# Patient Record
Sex: Female | Born: 1955 | Hispanic: No | Marital: Married | Smoking: Never smoker
Health system: Southern US, Community
[De-identification: ages and names within clinical notes are randomized; demographics above are authoritative.]

## PROBLEM LIST (undated history)

## (undated) DIAGNOSIS — I10 Essential (primary) hypertension: Secondary | ICD-10-CM

## (undated) DIAGNOSIS — E119 Type 2 diabetes mellitus without complications: Secondary | ICD-10-CM

## (undated) DIAGNOSIS — R7303 Prediabetes: Secondary | ICD-10-CM

## (undated) DIAGNOSIS — I639 Cerebral infarction, unspecified: Secondary | ICD-10-CM

## (undated) DIAGNOSIS — I8393 Asymptomatic varicose veins of bilateral lower extremities: Secondary | ICD-10-CM

## (undated) DIAGNOSIS — E785 Hyperlipidemia, unspecified: Secondary | ICD-10-CM

## (undated) HISTORY — DX: Prediabetes: R73.03

## (undated) HISTORY — DX: Asymptomatic varicose veins of bilateral lower extremities: I83.93

## (undated) HISTORY — DX: Hyperlipidemia, unspecified: E78.5

## (undated) HISTORY — DX: Essential (primary) hypertension: I10

## (undated) HISTORY — DX: Type 2 diabetes mellitus without complications: E11.9

---

## 2013-10-11 ENCOUNTER — Encounter (HOSPITAL_COMMUNITY): Payer: Self-pay | Admitting: Emergency Medicine

## 2013-10-11 ENCOUNTER — Emergency Department (HOSPITAL_COMMUNITY)
Admission: EM | Admit: 2013-10-11 | Discharge: 2013-10-12 | Disposition: A | Discharge status: Home / Self Care | Payer: Self-pay | Attending: Emergency Medicine | Admitting: Emergency Medicine

## 2013-10-11 DIAGNOSIS — G8929 Other chronic pain: Secondary | ICD-10-CM | POA: Insufficient documentation

## 2013-10-11 DIAGNOSIS — Z7901 Long term (current) use of anticoagulants: Secondary | ICD-10-CM | POA: Insufficient documentation

## 2013-10-11 DIAGNOSIS — L03019 Cellulitis of unspecified finger: Secondary | ICD-10-CM | POA: Insufficient documentation

## 2013-10-11 DIAGNOSIS — R519 Headache, unspecified: Secondary | ICD-10-CM

## 2013-10-11 DIAGNOSIS — R51 Headache: Secondary | ICD-10-CM | POA: Insufficient documentation

## 2013-10-11 DIAGNOSIS — Z8781 Personal history of (healed) traumatic fracture: Secondary | ICD-10-CM | POA: Insufficient documentation

## 2013-10-11 DIAGNOSIS — IMO0002 Reserved for concepts with insufficient information to code with codable children: Secondary | ICD-10-CM

## 2013-10-11 DIAGNOSIS — Z23 Encounter for immunization: Secondary | ICD-10-CM | POA: Insufficient documentation

## 2013-10-11 NOTE — ED Notes (Signed)
New EKG given to Dr Lavella LemonsManly

## 2013-10-11 NOTE — ED Notes (Signed)
Per pt's family: pt is having generalized body ache, shortness of breath and weakness for two weeks. Pt reports more pain on the right side of the body as well as chest and upper back. Pt states she has taken tylenol without relief. Pt is A&Ox4, respirations equal and unlabored, skin warm and dry

## 2013-10-12 ENCOUNTER — Emergency Department (HOSPITAL_COMMUNITY): Payer: Self-pay

## 2013-10-12 LAB — COMPREHENSIVE METABOLIC PANEL
ALK PHOS: 83 U/L (ref 39–117)
ALT: 10 U/L (ref 0–35)
AST: 17 U/L (ref 0–37)
Albumin: 3.2 g/dL — ABNORMAL LOW (ref 3.5–5.2)
BUN: 10 mg/dL (ref 6–23)
CHLORIDE: 105 meq/L (ref 96–112)
CO2: 22 meq/L (ref 19–32)
CREATININE: 0.48 mg/dL — AB (ref 0.50–1.10)
Calcium: 9.1 mg/dL (ref 8.4–10.5)
GFR calc Af Amer: >90 mL/min (ref >90)
GLUCOSE: 100 mg/dL — AB (ref 70–99)
POTASSIUM: 4.1 meq/L (ref 3.7–5.3)
Sodium: 140 mEq/L (ref 137–147)
Total Protein: 8.5 g/dL — ABNORMAL HIGH (ref 6.0–8.3)

## 2013-10-12 LAB — I-STAT TROPONIN, ED: TROPONIN I, POC: 0 ng/mL (ref 0.00–0.08)

## 2013-10-12 LAB — CBC WITH DIFFERENTIAL/PLATELET
Basophils Absolute: 0 10*3/uL (ref 0.0–0.1)
Basophils Relative: 0 % (ref 0–1)
Eosinophils Absolute: 0.1 10*3/uL (ref 0.0–0.7)
Eosinophils Relative: 1 % (ref 0–5)
HCT: 40.8 % (ref 36.0–46.0)
HEMOGLOBIN: 13.3 g/dL (ref 12.0–15.0)
LYMPHS ABS: 3.5 10*3/uL (ref 0.7–4.0)
LYMPHS PCT: 36 % (ref 12–46)
MCH: 27.3 pg (ref 26.0–34.0)
MCHC: 32.6 g/dL (ref 30.0–36.0)
MCV: 83.8 fL (ref 78.0–100.0)
MONO ABS: 0.9 10*3/uL (ref 0.1–1.0)
MONOS PCT: 9 % (ref 3–12)
NEUTROS ABS: 5.2 10*3/uL (ref 1.7–7.7)
NEUTROS PCT: 54 % (ref 43–77)
Platelets: 243 10*3/uL (ref 150–400)
RBC: 4.87 MIL/uL (ref 3.87–5.11)
RDW: 14.7 % (ref 11.5–15.5)
WBC: 9.7 10*3/uL (ref 4.0–10.5)

## 2013-10-12 LAB — PROTIME-INR
INR: 0.96 (ref 0.00–1.49)
Prothrombin Time: 12.6 seconds (ref 11.6–15.2)

## 2013-10-12 MED ORDER — TETANUS-DIPHTHERIA TOXOIDS TD 5-2 LFU IM INJ
0.5000 mL | INJECTION | Freq: Once | INTRAMUSCULAR | Status: AC
  Administered 2013-10-12: 0.5 mL via INTRAMUSCULAR
  Filled 2013-10-12: qty 0.5

## 2013-10-12 MED ORDER — SULFAMETHOXAZOLE-TRIMETHOPRIM 800-160 MG PO TABS: 1.0000 | ORAL_TABLET | Freq: Two times a day (BID) | ORAL | Status: AC

## 2013-10-12 NOTE — ED Notes (Signed)
Patient transported to CT 

## 2013-10-12 NOTE — ED Notes (Signed)
Patient transported to X-ray 

## 2013-10-12 NOTE — ED Notes (Signed)
MD at bedside. 

## 2013-10-12 NOTE — ED Provider Notes (Signed)
CSN: [id number]     Arrival date & time 10/11/13  2250 History   First MD Initiated Contact with Patient 10/11/13 2358     Chief Complaint  Patient presents with  . Chest Pain  . Generalized Body Aches  . Headache  . Weakness     (Consider location/radiation/quality/duration/timing/severity/associated sxs/prior Treatment) HPI  Patient is a 58 year old Arabic speaking woman who was recently immigrated from [place]. She is here with her daughter and son-in-law who translates.  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 She is taking Coumadin which has been prescribed to her from Dr. in [place]. He is not have a physician in this area yet. She has not had her INR checked.  Patient presents with right-sided headache. She has chronic right-sided headache. She has headaches all the time, every day. Always on the right side. She says her headache today is worse than her typical headache but at the same quality and nature. Nothing makes it worse or better. No fever. No neurologic deficits. No nausea, vomiting or photophobia.  The patient has an aching pain in her right neck. No paresthesias or motor weakness.  She also complains of pain in the distal aspect of her right middle finger. The pain is aching and throbbing.  History reviewed. No pertinent past medical history. History reviewed. No pertinent past surgical history. History reviewed. No pertinent family history. History  Substance Use Topics  . Smoking status: Never Smoker   . Smokeless tobacco: Never Used  . Alcohol Use: No   OB History   Grav Para Term Preterm Abortions TAB SAB Ect Mult Living                 Review of Systems Ten point review of symptoms performed and is negative with the exception of symptoms noted above.     Allergies  Review of patient's allergies indicates no known allergies.  Home Medications   Prior to Admission medications   Not on File   BP 138/85  Pulse 89  Temp(Src) 99.1 F  (37.3 C) (Oral)  Resp 20  SpO2 97% Physical Exam Gen: well developed and well nourished appearing Head: NCAT Eyes: PERL, EOMI Nose: no epistaixis or rhinorrhea Mouth/throat: mucosa is moist and pink Neck: supple, no stridor, no midline ttp. ttp over the right trapezius m.  Lungs: CTA B, no wheezing, rhonchi or rales CV: regular rate and rythm, good distal pulses.  Abd: soft, notender, nondistended Back: no ttp, no cva ttp Skin: warm and dry Ext: no edema, normal to inspection x for paronychia of the right middle finger Neuro: CN ii-xii grossly intact, no focal deficits, 5 over 5 motor strength all 4 extremities. Psyche; normal affect,  calm and cooperative.  ED Course  Procedures (including critical care time) Labs Review  Results for orders placed during the hospital encounter of 10/11/13 (from the past 24 hour(s))  PROTIME-INR     Status: None   Collection Time    10/12/13 12:17 AM      Result Value Ref Range   Prothrombin Time 12.6  11.6 - 15.2 seconds   INR 0.96  0.00 - 1.49  CBC WITH DIFFERENTIAL     Status: None   Collection Time    10/12/13 12:25 AM      Result Value Ref Range   WBC 9.7  4.0 - 10.5 K/uL   RBC 4.87  3.87 - 5.11 MIL/uL   Hemoglobin 13.3  12.0 - 15.0 g/dL  HCT 40.8  36.0 - 46.0 %   MCV 83.8  78.0 - 100.0 fL   MCH 27.3  26.0 - 34.0 pg   MCHC 32.6  30.0 - 36.0 g/dL   RDW 19.114.7  47.811.5 - 29.515.5 %   Platelets 243  150 - 400 K/uL   Neutrophils Relative % 54  43 - 77 %   Neutro Abs 5.2  1.7 - 7.7 K/uL   Lymphocytes Relative 36  12 - 46 %   Lymphs Abs 3.5  0.7 - 4.0 K/uL   Monocytes Relative 9  3 - 12 %   Monocytes Absolute 0.9  0.1 - 1.0 K/uL   Eosinophils Relative 1  0 - 5 %   Eosinophils Absolute 0.1  0.0 - 0.7 K/uL   Basophils Relative 0  0 - 1 %   Basophils Absolute 0.0  0.0 - 0.1 K/uL  COMPREHENSIVE METABOLIC PANEL     Status: Abnormal   Collection Time    10/12/13 12:25 AM      Result Value Ref Range   Sodium 140  137 - 147 mEq/L   Potassium  4.1  3.7 - 5.3 mEq/L   Chloride 105  96 - 112 mEq/L   CO2 22  19 - 32 mEq/L   Glucose, Bld 100 (*) 70 - 99 mg/dL   BUN 10  6 - 23 mg/dL   Creatinine, Ser 6.210.48 (*) 0.50 - 1.10 mg/dL   Calcium 9.1  8.4 - 30.810.5 mg/dL   Total Protein 8.5 (*) 6.0 - 8.3 g/dL   Albumin 3.2 (*) 3.5 - 5.2 g/dL   AST 17  0 - 37 U/L   ALT 10  0 - 35 U/L   Alkaline Phosphatase 83  39 - 117 U/L   Total Bilirubin <0.2 (*) 0.3 - 1.2 mg/dL   GFR calc non Af Amer >90  >90 mL/min   GFR calc Af Amer >90  >90 mL/min  I-STAT TROPOININ, ED     Status: None   Collection Time    10/12/13 12:42 AM      Result Value Ref Range   Troponin i, poc 0.00  0.00 - 0.08 ng/mL   Comment 3            CT head: no ICH  EKG: nsr, no acute ischemic changes, normal intervals, normal axis, normal qrs complex  PROCEDURE NOTE:  1% LIDOCAINE WITHOUT EPINEPHRINE USED TO PERFORM DIGITAL BLOCK TO THE RIGHT MIDDLE FINGER. ADEQUATE ANESTHESIA OBTAINED. 11 BLADE USED TO UNROOF SUSPECTED PARONYCHIA. NO PUS DRAINED. NO COMPLICATIONS. PATIENT TOLERATED THE PROCEDURE WELL.   MDM   Patient with paronychia - unable to drain any pus. Will tx with Bactrim. F[facility] at [facility]  Patient reports history of blood clot to brain and is on Warfarin for same. No old records. All of her tx and evaluation took place in [place]. Subtherapeutic on Warfarin. But, I she does not know her dose and I don't understand just what the indication is or was for warfarin. So, no adjustments will be made to medication. Patient referred to [facility] for further evaluation.     Brandt LoosenJulie Manly, MD 10/12/13 [phone]

## 2013-10-16 ENCOUNTER — Encounter (HOSPITAL_COMMUNITY): Payer: Self-pay | Admitting: Emergency Medicine

## 2013-10-16 DIAGNOSIS — Z7901 Long term (current) use of anticoagulants: Secondary | ICD-10-CM | POA: Insufficient documentation

## 2013-10-16 DIAGNOSIS — R234 Changes in skin texture: Secondary | ICD-10-CM | POA: Insufficient documentation

## 2013-10-16 DIAGNOSIS — Z792 Long term (current) use of antibiotics: Secondary | ICD-10-CM | POA: Insufficient documentation

## 2013-10-16 DIAGNOSIS — R21 Rash and other nonspecific skin eruption: Secondary | ICD-10-CM | POA: Insufficient documentation

## 2013-10-16 DIAGNOSIS — Z8673 Personal history of transient ischemic attack (TIA), and cerebral infarction without residual deficits: Secondary | ICD-10-CM | POA: Insufficient documentation

## 2013-10-16 DIAGNOSIS — R51 Headache: Secondary | ICD-10-CM | POA: Insufficient documentation

## 2013-10-16 LAB — URINALYSIS, ROUTINE W REFLEX MICROSCOPIC
Bilirubin Urine: NEGATIVE
Glucose, UA: NEGATIVE mg/dL
Hgb urine dipstick: NEGATIVE
Ketones, ur: NEGATIVE mg/dL
LEUKOCYTES UA: NEGATIVE
Nitrite: NEGATIVE
PROTEIN: NEGATIVE mg/dL
SPECIFIC GRAVITY, URINE: 1.007 (ref 1.005–1.030)
Urobilinogen, UA: 0.2 mg/dL (ref 0.0–1.0)
pH: 7 (ref 5.0–8.0)

## 2013-10-16 LAB — CBC
HCT: 40.2 % (ref 36.0–46.0)
Hemoglobin: 12.9 g/dL (ref 12.0–15.0)
MCH: 27.4 pg (ref 26.0–34.0)
MCHC: 32.1 g/dL (ref 30.0–36.0)
MCV: 85.4 fL (ref 78.0–100.0)
PLATELETS: 261 10*3/uL (ref 150–400)
RBC: 4.71 MIL/uL (ref 3.87–5.11)
RDW: 14.6 % (ref 11.5–15.5)
WBC: 7.6 10*3/uL (ref 4.0–10.5)

## 2013-10-16 NOTE — ED Notes (Addendum)
Pt reports she was seen here last Friday for migraine and was given a shot. Today she still has migraine and on her R arm where the injection was administered it appears red and indurated and tender to touch. Pt speaks arabic- family at bedside translating. Pt ambulatory, no distress noted at this time. Pt also reports some abdominal discomfort and pain when she urinates. Last BM today. Denies nv.

## 2013-10-17 ENCOUNTER — Emergency Department (HOSPITAL_COMMUNITY)
Admission: EM | Admit: 2013-10-17 | Discharge: 2013-10-17 | Disposition: A | Discharge status: Home / Self Care | Payer: Self-pay | Attending: Emergency Medicine | Admitting: Emergency Medicine

## 2013-10-17 ENCOUNTER — Emergency Department (HOSPITAL_COMMUNITY): Payer: Self-pay

## 2013-10-17 DIAGNOSIS — R51 Headache: Secondary | ICD-10-CM

## 2013-10-17 DIAGNOSIS — R519 Headache, unspecified: Secondary | ICD-10-CM

## 2013-10-17 DIAGNOSIS — T8090XA Unspecified complication following infusion and therapeutic injection, initial encounter: Secondary | ICD-10-CM

## 2013-10-17 HISTORY — DX: Cerebral infarction, unspecified: I63.9

## 2013-10-17 LAB — COMPREHENSIVE METABOLIC PANEL
ALBUMIN: 3.2 g/dL — AB (ref 3.5–5.2)
ALT: 12 U/L (ref 0–35)
AST: 19 U/L (ref 0–37)
Alkaline Phosphatase: 78 U/L (ref 39–117)
BUN: 9 mg/dL (ref 6–23)
CHLORIDE: 99 meq/L (ref 96–112)
CO2: 26 meq/L (ref 19–32)
Calcium: 9.2 mg/dL (ref 8.4–10.5)
Creatinine, Ser: 0.59 mg/dL (ref 0.50–1.10)
GFR calc Af Amer: >90 mL/min (ref >90)
Glucose, Bld: 130 mg/dL — ABNORMAL HIGH (ref 70–99)
Potassium: 4.4 mEq/L (ref 3.7–5.3)
Sodium: 140 mEq/L (ref 137–147)
Total Protein: 8.3 g/dL (ref 6.0–8.3)

## 2013-10-17 LAB — PROTIME-INR
INR: 0.99 (ref 0.00–1.49)
PROTHROMBIN TIME: 12.9 s (ref 11.6–15.2)

## 2013-10-17 MED ORDER — DEXAMETHASONE SODIUM PHOSPHATE 4 MG/ML IJ SOLN
10.0000 mg | Freq: Once | INTRAMUSCULAR | Status: AC
  Administered 2013-10-17: 10 mg via INTRAVENOUS
  Filled 2013-10-17: qty 3

## 2013-10-17 MED ORDER — METOCLOPRAMIDE HCL 5 MG/ML IJ SOLN
10.0000 mg | Freq: Once | INTRAMUSCULAR | Status: AC
  Administered 2013-10-17: 10 mg via INTRAVENOUS
  Filled 2013-10-17: qty 2

## 2013-10-17 MED ORDER — DIPHENHYDRAMINE HCL 50 MG/ML IJ SOLN
25.0000 mg | Freq: Once | INTRAMUSCULAR | Status: AC
  Administered 2013-10-17: 25 mg via INTRAVENOUS
  Filled 2013-10-17: qty 1

## 2013-10-17 MED ORDER — SODIUM CHLORIDE 0.9 % IV SOLN
INTRAVENOUS | Status: DC
Start: 2013-10-17 — End: 2013-10-17
  Administered 2013-10-17: 03:00:00 via INTRAVENOUS

## 2013-10-17 NOTE — ED Notes (Signed)
Pt states pain in upper right arm. NT Dan touched upper right arm and felt hardness of skin.

## 2013-10-17 NOTE — ED Provider Notes (Signed)
CSN: [id number]     Arrival date & time 10/16/13  2145 History   First MD Initiated Contact with Patient 10/17/13 0146     Chief Complaint  Patient presents with  . Migraine     (Consider location/radiation/quality/duration/timing/severity/associated sxs/prior Treatment) Patient is a 58 y.o. female presenting with migraines.  Migraine Associated symptoms include headaches. Pertinent negatives include no chest pain, no abdominal pain and no shortness of breath.   History provided by patient's family. Is from [place]. Patient does not speak English and her family translates. She is prescribed warfarin 3 mg which she takes daily for history of a stroke.  She did not bring medication with her but insists that she takes it as directed.  She was evaluated here a few days ago for right-sided headache. She received an injection in her right shoulder. Now she has redness and swelling at that injection site and persistent right-sided headache. She denies any weakness or numbness. No difficulty with speech, gait or vision. She states that the arm pain and headache are both severe. She was also evaluated for paronychia on previous visit and that has improved. She was given an outpatient referral but has not called for an appointment.  Past Medical History  Diagnosis Date  . Stroke     family reports stroke 2 years ago   History reviewed. No pertinent past surgical history. No family history on file. History  Substance Use Topics  . Smoking status: Never Smoker   . Smokeless tobacco: Never Used  . Alcohol Use: No   OB History   Grav Para Term Preterm Abortions TAB SAB Ect Mult Living                 Review of Systems  Constitutional: Negative for fever and chills.  Eyes: Negative for pain.  Respiratory: Negative for shortness of breath.   Cardiovascular: Negative for chest pain.  Gastrointestinal: Negative for abdominal pain.  Genitourinary: Negative for dysuria.  Musculoskeletal: Negative  for back pain, neck pain and neck stiffness.  Skin: Positive for rash.  Neurological: Positive for headaches.  All other systems reviewed and are negative.     Allergies  Review of patient's allergies indicates no known allergies.  Home Medications   Prior to Admission medications   Medication Sig Start Date End Date Taking? Authorizing Provider  acetaminophen (TYLENOL) 500 MG tablet Take 500 mg by mouth every 6 (six) hours as needed for moderate pain.   Yes Historical Provider, MD  Multiple Vitamin (MULTIVITAMIN WITH MINERALS) TABS tablet Take 1 tablet by mouth daily.   Yes Historical Provider, MD  sulfamethoxazole-trimethoprim (BACTRIM DS,SEPTRA DS) 800-160 MG per tablet Take 1 tablet by mouth 2 (two) times daily. 10/12/13 10/19/13 Yes Brandt LoosenJulie Manly, MD  warfarin (COUMADIN) 3 MG tablet Take 3 mg by mouth daily.   Yes Historical Provider, MD   BP 124/85  Pulse 98  Temp(Src) 98.9 F (37.2 C) (Oral)  Resp 24  SpO2 97% Physical Exam  Constitutional: She appears well-developed and well-nourished.  HENT:  Head: Normocephalic and atraumatic.  Eyes: EOM are normal. Pupils are equal, round, and reactive to light.  Neck: Neck supple.  Cardiovascular: Normal rate, regular rhythm and intact distal pulses.   Pulmonary/Chest: Effort normal and breath sounds normal. No respiratory distress.  Abdominal: Soft. Bowel sounds are normal. She exhibits no distension. There is no tenderness.  Musculoskeletal: Normal range of motion.  Small area of induration over right deltoid. There is no erythema or increased warmth  to touch. There is no underlying fluctuance. No drainage. No pointing. Good range of motion throughout all major joints of the right upper extremity with distal neurovascular intact.  Neurological: She is alert.  Awake and alert. Speech clear. Equal grips, triceps biceps strength. Equal dorsi plantar flexion. Moving all extremities x4. No pronator drift. Fine motor movements intact upper  extremities.  Skin: Skin is warm and dry.    ED Course  Procedures (including critical care time) Labs Review Labs Reviewed  COMPREHENSIVE METABOLIC PANEL - Abnormal; Notable for the following:    Glucose, Bld 130 (*)    Albumin 3.2 (*)    Total Bilirubin <0.2 (*)    All other components within normal limits  CBC  URINALYSIS, ROUTINE W REFLEX MICROSCOPIC  PROTIME-INR    Imaging Review Ct Head Wo Contrast  10/17/2013   CLINICAL DATA:  Persistent migraine headaches.  EXAM: CT HEAD WITHOUT CONTRAST  TECHNIQUE: Contiguous axial images were obtained from the base of the skull through the vertex without intravenous contrast.  COMPARISON:  10/12/2013  FINDINGS: Ventricles and sulci appear symmetrical. No ventricular dilatation. Basal ganglia calcifications. No mass effect or midline shift. No abnormal extra-axial fluid collections. Gray-white matter junctions are distinct. Basal cisterns are not effaced. No evidence of acute intracranial hemorrhage. No depressed skull fractures. Visualized paranasal sinuses and mastoid air cells are not opacified. No change since previous study.  IMPRESSION: No acute intracranial abnormalities.   Electronically Signed   By: Burman NievesWilliam  Stevens M.D.   On: 10/17/2013 03:15     EKG Interpretation None     IV fluids and headache cocktail provided. Ice placed to right deltoid injection site.  4:19 AM on recheck headache has resolved. Arm pain feels better.  Plan discharge home with followup at the [facility]. Patient and family agree to call the morning to schedule an appointment. Stable appropriate for discharge at this time. Now has 2 visits with INR in normal range, and patient insisting that she is taking warfarin. I asked family to bring this medication to her appointment and they agree. Strict return precautions provided verbalized as understood. MDM   Diagnosis: Headache, induration recent injection site right deltoid  Non-English-speaking patient  comes in with family, states she is on Coumadin with severe headache. She was evaluated CT scan and treated with IV fluids. Her headache resolved. No intracranial bleed. No neuro deficits. Patient also complaining of right deltoid pain with induration at recent injection site. No evidence of cellulitis. There is no underlying abscess clinically. Symptomatically improved with medications and ice. I do not feel further treatment is indicated at this point. Vital signs and nursing notes reviewed and considered. Previous EMR records reviewed - patient also had normal INR with previous visit, states she has not missed any doses of warfarin.     Sunnie NielsenBrian Savhanna Sliva, MD 10/17/13 [phone]

## 2013-10-17 NOTE — Discharge Instructions (Signed)

## 2014-08-03 IMAGING — CT CT HEAD W/O CM
1 series · 16 of 27 positions shown, 20 images · non-contrast
Comparison: 10/12/2013

CLINICAL DATA: Persistent migraine headaches.

EXAM:
CT HEAD WITHOUT CONTRAST
TECHNIQUE: Contiguous axial images were obtained from the base of the skull
through the vertex without intravenous contrast.

[Series 2: head 5.0 h30s · axial · 0.41mm/px · z∈[+1329,+1449]mm · 16 of 27 slices shown, 20 images]
[im 2/27  brain]
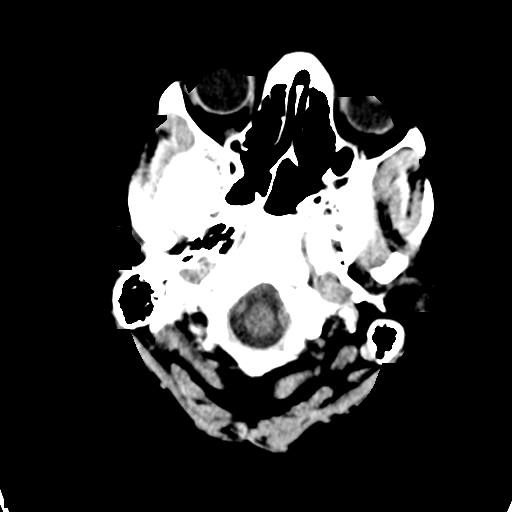
[im 2/27  bone]
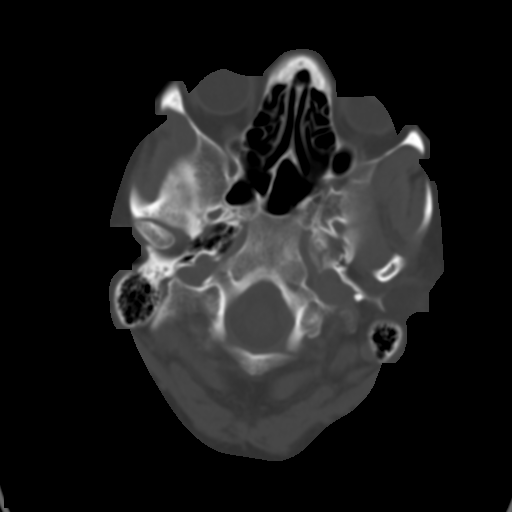
[im 4/27  brain]
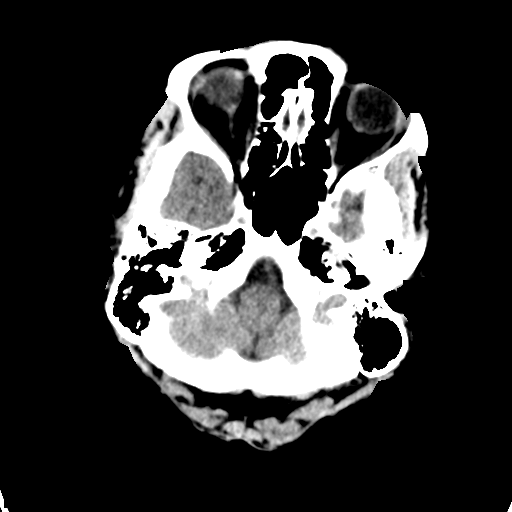
[im 5/27  brain]
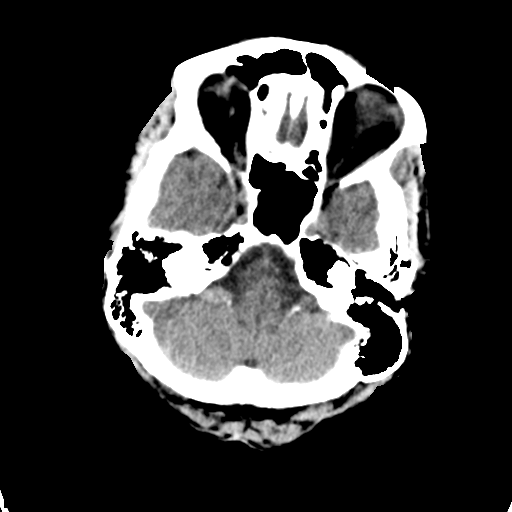
[im 7/27  brain]
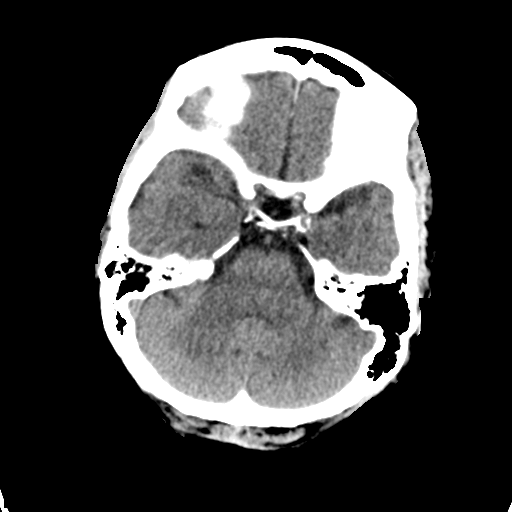
[im 9/27  brain]
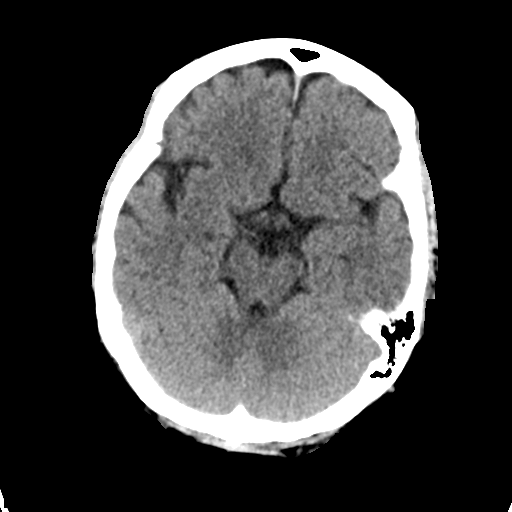
[im 9/27  bone]
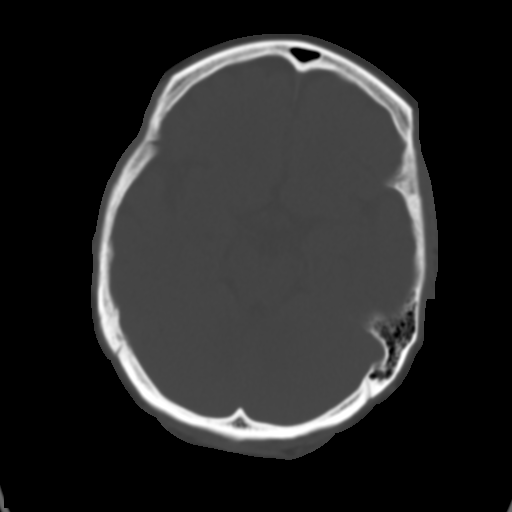
[im 10/27  brain]
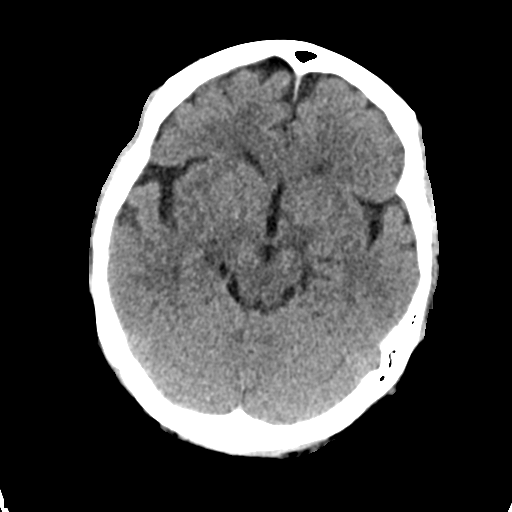
[im 12/27  brain]
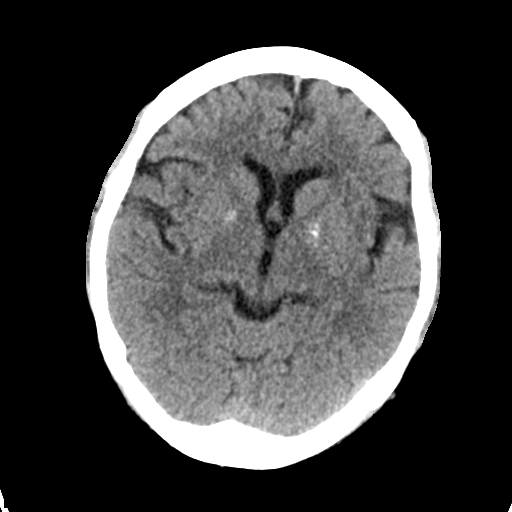
[im 13/27  brain]
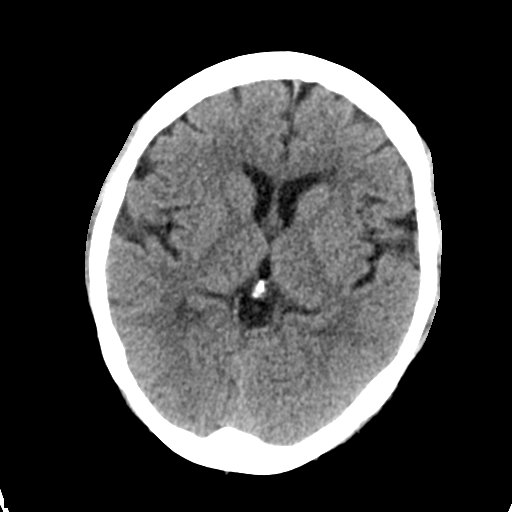
[im 15/27  brain]
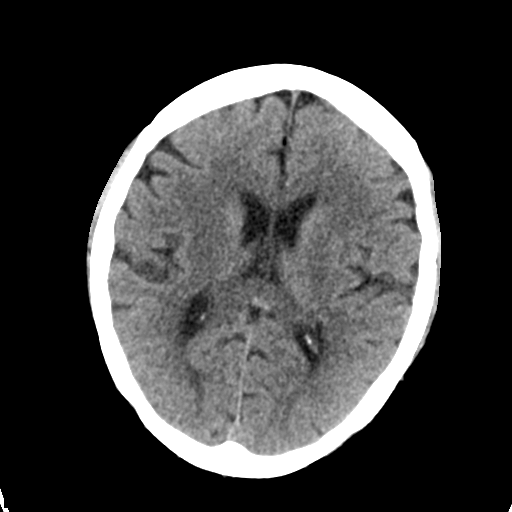
[im 15/27  bone]
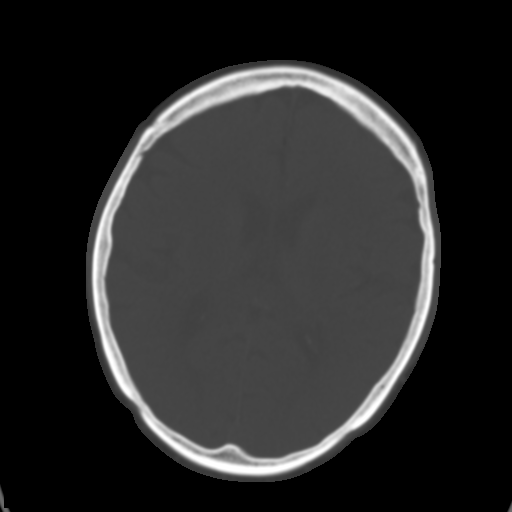
[im 16/27  brain]
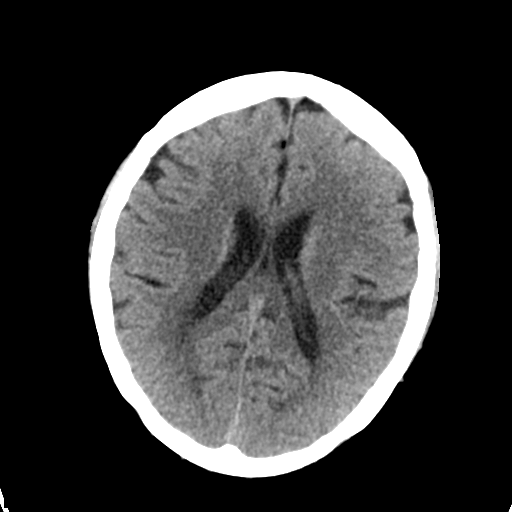
[im 18/27  brain]
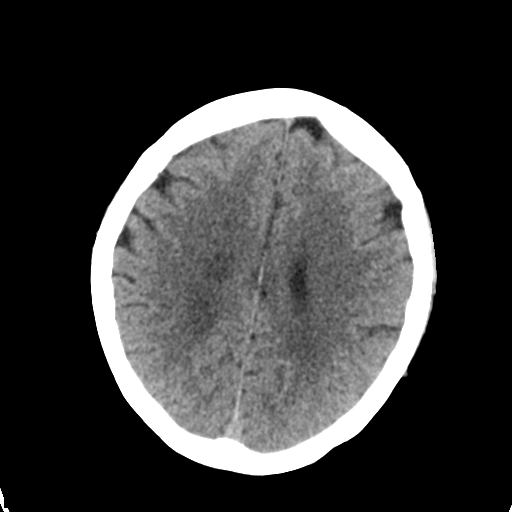
[im 19/27  brain]
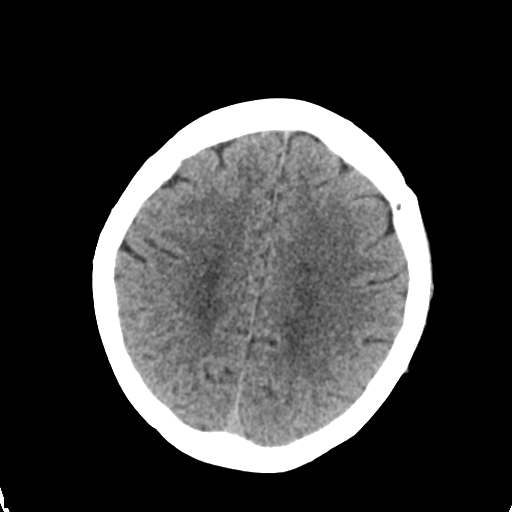
[im 21/27  brain]
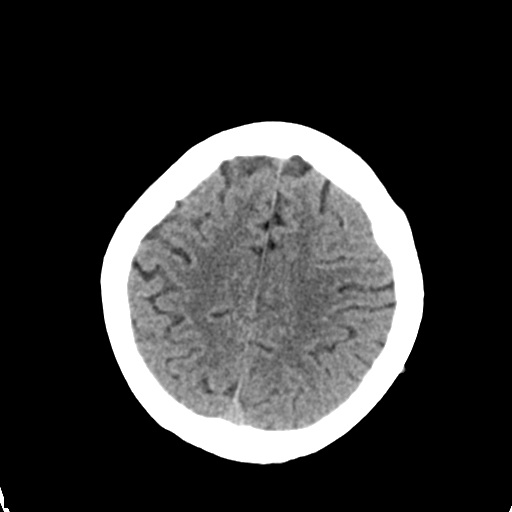
[im 21/27  bone]
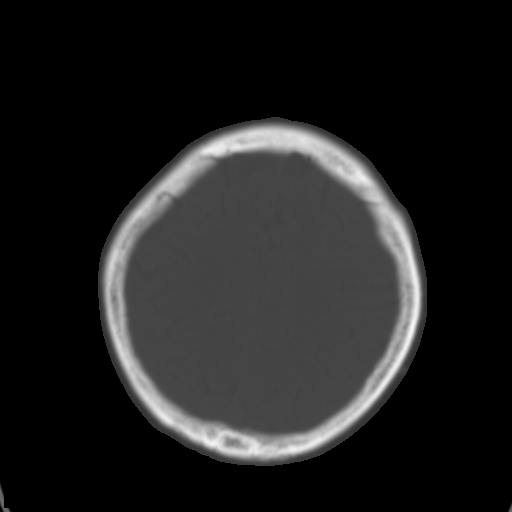
[im 23/27  brain]
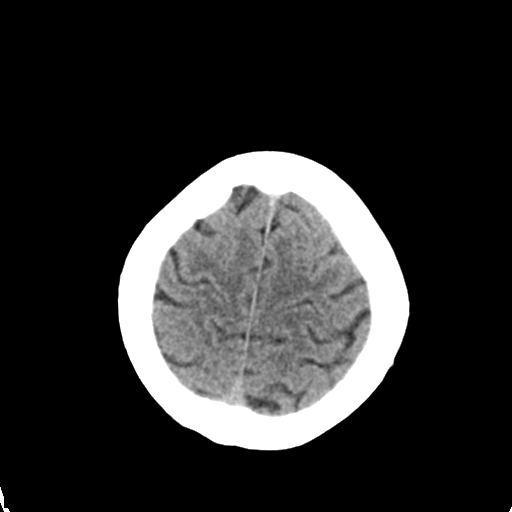
[im 24/27  brain]
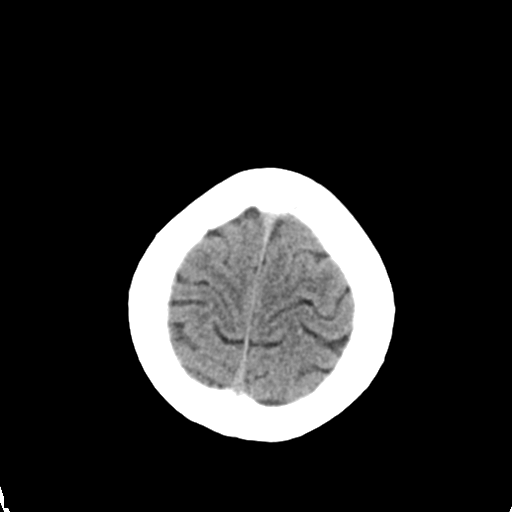
[im 26/27  brain]
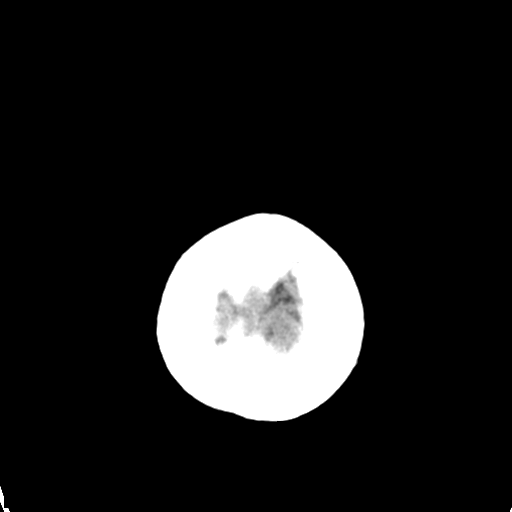

[16 of 27 positions shown; findings below may reference images not displayed]

FINDINGS: Ventricles and sulci appear symmetrical. No ventricular dilatation.
Basal ganglia calcifications. No mass effect or midline shift. No
abnormal extra-axial fluid collections. Gray-white matter junctions
are distinct. Basal cisterns are not effaced. No evidence of acute
intracranial hemorrhage. No depressed skull fractures. Visualized
paranasal sinuses and mastoid air cells are not opacified. No change
since previous study.
IMPRESSION: No acute intracranial abnormalities.

## 2015-12-09 ENCOUNTER — Ambulatory Visit: Payer: Self-pay | Attending: Internal Medicine

## 2015-12-23 ENCOUNTER — Telehealth: Payer: Self-pay | Admitting: *Deleted

## 2015-12-23 ENCOUNTER — Ambulatory Visit (INDEPENDENT_AMBULATORY_CARE_PROVIDER_SITE_OTHER): Payer: Self-pay | Admitting: Internal Medicine

## 2015-12-23 ENCOUNTER — Encounter: Payer: Self-pay | Admitting: Internal Medicine

## 2015-12-23 VITALS — BP 140/65 | HR 77 | Temp 98.0°F | Ht 66.0 in | Wt 187.4 lb

## 2015-12-23 DIAGNOSIS — E119 Type 2 diabetes mellitus without complications: Secondary | ICD-10-CM

## 2015-12-23 DIAGNOSIS — I8393 Asymptomatic varicose veins of bilateral lower extremities: Secondary | ICD-10-CM

## 2015-12-23 DIAGNOSIS — I10 Essential (primary) hypertension: Secondary | ICD-10-CM

## 2015-12-23 DIAGNOSIS — N393 Stress incontinence (female) (male): Secondary | ICD-10-CM

## 2015-12-23 DIAGNOSIS — E785 Hyperlipidemia, unspecified: Secondary | ICD-10-CM | POA: Insufficient documentation

## 2015-12-23 DIAGNOSIS — R7303 Prediabetes: Secondary | ICD-10-CM | POA: Insufficient documentation

## 2015-12-23 HISTORY — DX: Essential (primary) hypertension: I10

## 2015-12-23 HISTORY — DX: Hyperlipidemia, unspecified: E78.5

## 2015-12-23 HISTORY — DX: Asymptomatic varicose veins of bilateral lower extremities: I83.93

## 2015-12-23 LAB — POCT GLYCOSYLATED HEMOGLOBIN (HGB A1C): HEMOGLOBIN A1C: 6.7

## 2015-12-23 LAB — GLUCOSE, CAPILLARY: Glucose-Capillary: 182 mg/dL — ABNORMAL HIGH (ref 65–99)

## 2015-12-23 MED ORDER — AMLODIPINE BESYLATE 5 MG PO TABS: 5.0000 mg | ORAL_TABLET | Freq: Every day | ORAL | Status: DC

## 2015-12-23 MED ORDER — ASPIRIN EC 81 MG PO TBEC: 81.0000 mg | DELAYED_RELEASE_TABLET | Freq: Every day | ORAL | Status: DC

## 2015-12-23 NOTE — Progress Notes (Signed)
[facility] Subjective:   Patient ID: Christine Cole female   DOB: 20-Jun-1956 60 y.o.   MRN: [id number]  HPI: Christine Cole is a 60 y.o. female with a PMH detailed below who presents for to establish care for diabetes. She is from [place] and speaks arabic.  An interpreter was used.  She is joined by her daughter.  She has previously traveled back and forth from [place], however recently in the last few months she decided to move to the [place] to live with her daughter.  She notes that she previously had a history of a blood clot in the brain, she denies a history of Afib.  She may have been on warfarin for a short time after this stroke (diagnosed in [place]) but at least for the last 2-3 years she has just taken a daily aspirin.  She has been told that she has high cholesterol and was once prescribed a medication for it, however she never took that medication.  She does have a history of hypertension, she currently takes Amlodipine 5mg  daily.  Recently 2-3 months ago she reports she was diagnosed with DM but she was not placed on medication.  She does not have any polydispia, polyuria or blurry vision. She does report that she occasionally leaks urine, this typically happens if she coughs or sneezes.  She has had 8 children by vaginal delivery.  She notes that her stork affected her right side but her weakness improved after 1 week.  She does report that she does have some chronic pain in her right leg for which she takes tylenol PRN.  She thinks this is related to the stroke.  The area is both above and below the right knee but does not affect the knee itself.  It is a sharp pain, but not burning or tingling.    Past Medical History  Diagnosis Date  . Stroke [facility](HCC)     family reports stroke 2 years ago  . Essential hypertension 12/23/2015  . Varicose veins of both lower extremities without ulcer or inflammation 12/23/2015  . Hyperlipidemia  12/23/2015  . Diabetes [facility](HCC)    Current Outpatient Prescriptions  Medication Sig Dispense Refill  . amLODipine (NORVASC) 5 MG tablet Take 1 tablet (5 mg total) by mouth daily. 90 tablet 1  . acetaminophen (TYLENOL) 500 MG tablet Take 500 mg by mouth every 6 (six) hours as needed for moderate pain.    Marland Kitchen. aspirin EC 81 MG tablet Take 1 tablet (81 mg total) by mouth daily. 90 tablet 3  . Multiple Vitamin (MULTIVITAMIN WITH MINERALS) TABS tablet Take 1 tablet by mouth daily.     No current facility-administered medications for this visit.   Family History  Problem Relation Age of Onset  . Stroke Mother   . Diabetes Father    Social History   Social History  . Marital Status: Married    Spouse Name: N/A  . Number of Children: N/A  . Years of Education: N/A   Social History Main Topics  . Smoking status: Never Smoker   . Smokeless tobacco: Never Used  . Alcohol Use: No  . Drug Use: No  . Sexual Activity: Not Asked   Other Topics Concern  . None   Social History Narrative   Originally from [place], lives with Daughter   Review of Systems: Review of Systems  Constitutional: Negative for fever, chills, weight loss and malaise/fatigue.  HENT: Negative  for hearing loss.   Eyes: Negative for blurred vision and double vision.  Respiratory: Negative for cough and shortness of breath.   Cardiovascular: Negative for chest pain and leg swelling.  Gastrointestinal: Negative for heartburn and abdominal pain.  Genitourinary: Negative for dysuria and frequency.  Musculoskeletal: Negative for myalgias and falls.  Skin: Negative for itching and rash.  Neurological: Negative for dizziness, focal weakness and headaches.  Endo/Heme/Allergies: Negative for polydipsia. Does not bruise/bleed easily.  Psychiatric/Behavioral: Negative for depression and memory loss. The patient is not nervous/anxious.      Objective:  Physical Exam: Filed Vitals:   12/23/15 0932  BP: 140/65  Pulse: 77  Temp:  98 F (36.7 C)  TempSrc: Oral  Height: 5\' 6"  (1.676 m)  Weight: 187 lb 6.4 oz (85.004 kg)  SpO2: 99%   Physical Exam  Constitutional: She is well-developed, well-nourished, and in no distress.  HENT:  Head: Normocephalic and atraumatic.  Mouth/Throat: Oropharynx is clear and moist.  Eyes: Conjunctivae are normal.  Cardiovascular: Normal rate and regular rhythm.   Pulmonary/Chest: Effort normal and breath sounds normal.  Abdominal: Soft. Bowel sounds are normal.  Musculoskeletal: She exhibits no edema.  5/5 gross muscle strength of upper and lower extremities bilaterally.  She appears to have some tenderness to light touch of her right lateral thigh and calf.  Neurological: She is alert.  Skin:  varicose veins of bilateral lower extremities  Psychiatric: Affect normal.  Nursing note and vitals reviewed.   Assessment & Plan:  Case discussed with Dr. Rogelia BogaButcher Please see problem based A&P notes   Medications Ordered Meds ordered this encounter  Medications  . DISCONTD: amLODipine (NORVASC) 5 MG tablet    Sig: Take 5 mg by mouth daily.  Marland Kitchen. amLODipine (NORVASC) 5 MG tablet    Sig: Take 1 tablet (5 mg total) by mouth daily.    Dispense:  90 tablet    Refill:  1  . aspirin EC 81 MG tablet    Sig: Take 1 tablet (81 mg total) by mouth daily.    Dispense:  90 tablet    Refill:  3   Other Orders Orders Placed This Encounter  Procedures  . CMP14 + Anion Gap  . CBC with Diff  . Lipid Profile  . Glucose, capillary  . POC Hbg A1C   Follow Up: Return in about 3 months (around 03/24/2016).

## 2015-12-23 NOTE — Telephone Encounter (Signed)
Call from patient's daughter.  Went to pharmacy unable to afford patient's Amlodipine. Dr. Mikey BussingHoffman informed of . Will call patient .  Angelina OkGladys Kiona Blume, RN 12/23/2015 12:09 PM

## 2015-12-23 NOTE — Patient Instructions (Signed)
General Instructions:   Please bring your medicines with you each time you come to clinic.  Medicines may include prescription medications, over-the-counter medications, herbal remedies, eye drops, vitamins, or other pills.   Progress Toward Treatment Goals:  Treatment Goal 12/23/2015  Hemoglobin A1C unable to assess  Blood pressure at goal    Self Care Goals & Plans:  Self Care Goal 12/23/2015  Manage my medications take my medicines as prescribed    Home Blood Glucose Monitoring 12/23/2015  Check my blood sugar no home glucose monitoring     Care Management & Community Referrals:  Referral 12/23/2015  Referrals made for care management support none needed      Diabetes Mellitus and Food It is important for you to manage your blood sugar (glucose) level. Your blood glucose level can be greatly affected by what you eat. Eating healthier foods in the appropriate amounts throughout the day at about the same time each day will help you control your blood glucose level. It can also help slow or prevent worsening of your diabetes mellitus. Healthy eating may even help you improve the level of your blood pressure and reach or maintain a healthy weight.  General recommendations for healthful eating and cooking habits include: 1. Eating meals and snacks regularly. Avoid going long periods of time without eating to lose weight. 2. Eating a diet that consists mainly of plant-based foods, such as fruits, vegetables, nuts, legumes, and whole grains. 3. Using low-heat cooking methods, such as baking, instead of high-heat cooking methods, such as deep frying. Work with your dietitian to make sure you understand how to use the Nutrition Facts information on food labels. HOW CAN FOOD AFFECT ME? Carbohydrates Carbohydrates affect your blood glucose level more than any other type of food. Your dietitian will help you determine how many carbohydrates to eat at each meal and teach you how to count  carbohydrates. Counting carbohydrates is important to keep your blood glucose at a healthy level, especially if you are using insulin or taking certain medicines for diabetes mellitus. Alcohol Alcohol can cause sudden decreases in blood glucose (hypoglycemia), especially if you use insulin or take certain medicines for diabetes mellitus. Hypoglycemia can be a life-threatening condition. Symptoms of hypoglycemia (sleepiness, dizziness, and disorientation) are similar to symptoms of having too much alcohol.  If your health care provider has given you approval to drink alcohol, do so in moderation and use the following guidelines:  Women should not have more than one drink per day, and men should not have more than two drinks per day. One drink is equal to:  12 oz of beer.  5 oz of wine.  1 oz of hard liquor.  Do not drink on an empty stomach.  Keep yourself hydrated. Have water, diet soda, or unsweetened iced tea.  Regular soda, juice, and other mixers might contain a lot of carbohydrates and should be counted. WHAT FOODS ARE NOT RECOMMENDED? As you make food choices, it is important to remember that all foods are not the same. Some foods have fewer nutrients per serving than other foods, even though they might have the same number of calories or carbohydrates. It is difficult to get your body what it needs when you eat foods with fewer nutrients. Examples of foods that you should avoid that are high in calories and carbohydrates but low in nutrients include:  Trans fats (most processed foods list trans fats on the Nutrition Facts label).  Regular soda.  Juice.  Candy.  Sweets, such as cake, pie, doughnuts, and cookies.  Fried foods. WHAT FOODS CAN I EAT? Eat nutrient-rich foods, which will nourish your body and keep you healthy. The food you should eat also will depend on several factors, including:  The calories you need.  The medicines you take.  Your weight.  Your blood  glucose level.  Your blood pressure level.  Your cholesterol level. You should eat a variety of foods, including:  Protein.  Lean cuts of meat.  Proteins low in saturated fats, such as fish, egg whites, and beans. Avoid processed meats.  Fruits and vegetables.  Fruits and vegetables that may help control blood glucose levels, such as apples, mangoes, and yams.  Dairy products.  Choose fat-free or low-fat dairy products, such as milk, yogurt, and cheese.  Grains, bread, pasta, and rice.  Choose whole grain products, such as multigrain bread, whole oats, and brown rice. These foods may help control blood pressure.  Fats.  Foods containing healthful fats, such as nuts, avocado, olive oil, canola oil, and fish. DOES EVERYONE WITH DIABETES MELLITUS HAVE THE SAME MEAL PLAN? Because every person with diabetes mellitus is different, there is not one meal plan that works for everyone. It is very important that you meet with a dietitian who will help you create a meal plan that is just right for you.   This information is not intended to replace advice given to you by your health care provider. Make sure you discuss any questions you have with your health care provider.   Document Released: 03/10/2005 Document Revised: 07/04/2014 Document Reviewed: 05/10/2013 Elsevier Interactive Patient Education 2016 [employer]  Kegel Exercises The goal of Kegel exercises is to isolate and exercise your pelvic floor muscles. These muscles act as a hammock that supports the rectum, vagina, small intestine, and uterus. As the muscles weaken, the hammock sags and these organs are displaced from their normal positions. Kegel exercises can strengthen your pelvic floor muscles and help you to improve bladder and bowel control, improve sexual response, and help reduce many problems and some discomfort during pregnancy. Kegel exercises can be done anywhere and at any time. HOW TO PERFORM KEGEL  EXERCISES 4. Locate your pelvic floor muscles. To do this, squeeze (contract) the muscles that you use when you try to stop the flow of urine. You will feel a tightness in the vaginal area (women) and a tight lift in the rectal area (men and women). 5. When you begin, contract your pelvic muscles tight for 2-5 seconds, then relax them for 2-5 seconds. This is one set. Do 4-5 sets with a short pause in between. 6. Contract your pelvic muscles for 8-10 seconds, then relax them for 8-10 seconds. Do 4-5 sets. If you cannot contract your pelvic muscles for 8-10 seconds, try 5-7 seconds and work your way up to 8-10 seconds. Your goal is 4-5 sets of 10 contractions each day. Keep your stomach, buttocks, and legs relaxed during the exercises. Perform sets of both short and long contractions. Vary your positions. Perform these contractions 3-4 times per day. Perform sets while you are:   Lying in bed in the morning.  Standing at lunch.  Sitting in the late afternoon.  Lying in bed at night. You should do 40-50 contractions per day. Do not perform more Kegel exercises per day than recommended. Overexercising can cause muscle fatigue. Continue these exercises for for at least 15-20 weeks or as directed by your caregiver.   This information is not  intended to replace advice given to you by your health care provider. Make sure you discuss any questions you have with your health care provider.   Document Released: 05/30/2012 Document Revised: 07/04/2014 Document Reviewed: 05/30/2012 Elsevier Interactive Patient Education [employer] Elsevier Inc.

## 2015-12-24 LAB — LIPID PANEL
CHOL/HDL RATIO: 3.6 ratio (ref 0.0–4.4)
Cholesterol, Total: 207 mg/dL — ABNORMAL HIGH (ref 100–199)
HDL: 57 mg/dL (ref >39)
LDL CALC: 99 mg/dL (ref 0–99)
TRIGLYCERIDES: 256 mg/dL — AB (ref 0–149)
VLDL Cholesterol Cal: 51 mg/dL — ABNORMAL HIGH (ref 5–40)

## 2015-12-24 LAB — CBC WITH DIFFERENTIAL/PLATELET
BASOS: 0 %
Basophils Absolute: 0 10*3/uL (ref 0.0–0.2)
EOS (ABSOLUTE): 0.2 10*3/uL (ref 0.0–0.4)
Eos: 2 %
HEMATOCRIT: 39.6 % (ref 34.0–46.6)
Hemoglobin: 12.6 g/dL (ref 11.1–15.9)
Immature Grans (Abs): 0 10*3/uL (ref 0.0–0.1)
Immature Granulocytes: 0 %
LYMPHS ABS: 3.3 10*3/uL — AB (ref 0.7–3.1)
Lymphs: 43 %
MCH: 26.5 pg — ABNORMAL LOW (ref 26.6–33.0)
MCHC: 31.8 g/dL (ref 31.5–35.7)
MCV: 83 fL (ref 79–97)
MONOCYTES: 6 %
MONOS ABS: 0.5 10*3/uL (ref 0.1–0.9)
NEUTROS ABS: 3.7 10*3/uL (ref 1.4–7.0)
Neutrophils: 49 %
Platelets: 265 10*3/uL (ref 150–379)
RBC: 4.75 x10E6/uL (ref 3.77–5.28)
RDW: 15.3 % (ref 12.3–15.4)
WBC: 7.6 10*3/uL (ref 3.4–10.8)

## 2015-12-24 LAB — CMP14 + ANION GAP
ALBUMIN: 3.9 g/dL (ref 3.5–5.5)
ALK PHOS: 82 IU/L (ref 39–117)
ALT: 10 IU/L (ref 0–32)
ANION GAP: 20 mmol/L — AB (ref 10.0–18.0)
AST: 12 IU/L (ref 0–40)
Albumin/Globulin Ratio: 1.1 — ABNORMAL LOW (ref 1.2–2.2)
BUN/Creatinine Ratio: 20 (ref 9–23)
BUN: 11 mg/dL (ref 6–24)
Bilirubin Total: <0.2 mg/dL (ref 0.0–1.2)
CALCIUM: 9.1 mg/dL (ref 8.7–10.2)
CO2: 22 mmol/L (ref 18–29)
CREATININE: 0.54 mg/dL — AB (ref 0.57–1.00)
Chloride: 100 mmol/L (ref 96–106)
GFR, EST AFRICAN AMERICAN: 119 mL/min/{1.73_m2} (ref >59)
GFR, EST NON AFRICAN AMERICAN: 104 mL/min/{1.73_m2} (ref >59)
GLOBULIN, TOTAL: 3.4 g/dL (ref 1.5–4.5)
Glucose: 178 mg/dL — ABNORMAL HIGH (ref 65–99)
Potassium: 4.6 mmol/L (ref 3.5–5.2)
SODIUM: 142 mmol/L (ref 134–144)
TOTAL PROTEIN: 7.3 g/dL (ref 6.0–8.5)

## 2015-12-25 ENCOUNTER — Encounter: Payer: Self-pay | Admitting: Internal Medicine

## 2015-12-25 DIAGNOSIS — N393 Stress incontinence (female) (male): Secondary | ICD-10-CM | POA: Insufficient documentation

## 2015-12-25 NOTE — Assessment & Plan Note (Signed)
A: Essential HTN at goal  P: Continue amlodipine 5mg  Check metabolic panel  Update: patient called that amlodipine is too expensive, I asked our pharmacist Dr Selena BattenKim to look into other pharmacy options, alternatively if this is not possible we may consider a change to HCTZ 25mg  daily and have her back in 1 month for follow up and repeat BMP.

## 2015-12-25 NOTE — Assessment & Plan Note (Signed)
Stable

## 2015-12-25 NOTE — Progress Notes (Signed)
Internal Medicine Clinic Attending  Case discussed with Dr. Hoffman at the time of the visit.  We reviewed the resident's history and exam and pertinent patient test results.  I agree with the assessment, diagnosis, and plan of care documented in the resident's note.  

## 2015-12-25 NOTE — Assessment & Plan Note (Addendum)
Check Lipid panel Consider statin due to risk, will discuss at next visit.

## 2015-12-25 NOTE — Assessment & Plan Note (Signed)
A: Controlled Type 2 DM without complication  P: Set A1c goal of 6.5% Will work on diet and exercise Given new diagnosis we do not necessarily need to get an eye exam at this time.

## 2015-12-25 NOTE — Assessment & Plan Note (Signed)
Recommended Kegal exercises.

## 2016-01-06 ENCOUNTER — Ambulatory Visit: Payer: Self-pay

## 2016-01-06 ENCOUNTER — Ambulatory Visit: Payer: Self-pay | Admitting: Pharmacist

## 2016-01-06 DIAGNOSIS — Z79899 Other long term (current) drug therapy: Secondary | ICD-10-CM

## 2016-01-06 NOTE — Progress Notes (Signed)
Ms. Christine Cole and her daughter presented to the clinic for medication assistance. The patient completed and application for [facility].

## 2016-01-12 ENCOUNTER — Other Ambulatory Visit: Payer: Self-pay | Admitting: Pharmacist

## 2016-01-12 DIAGNOSIS — I10 Essential (primary) hypertension: Secondary | ICD-10-CM

## 2016-01-12 MED ORDER — ASPIRIN EC 81 MG PO TBEC: 81.0000 mg | DELAYED_RELEASE_TABLET | Freq: Every day | ORAL | Status: AC

## 2016-01-12 MED ORDER — AMLODIPINE BESYLATE 5 MG PO TABS: 5.0000 mg | ORAL_TABLET | Freq: Every day | ORAL | Status: AC

## 2016-01-12 NOTE — Progress Notes (Signed)
Pharmacy student enrolled patient into [facility], prescriptions transferred.

## 2016-01-18 ENCOUNTER — Ambulatory Visit: Payer: Self-pay | Admitting: Family Medicine

## 2016-09-20 ENCOUNTER — Encounter: Payer: Self-pay | Admitting: Internal Medicine

## 2017-02-14 ENCOUNTER — Ambulatory Visit: Payer: Self-pay

## 2017-08-23 ENCOUNTER — Encounter: Payer: Self-pay | Admitting: Internal Medicine

## 2017-08-24 ENCOUNTER — Ambulatory Visit: Payer: Self-pay

## 2018-05-08 ENCOUNTER — Encounter: Payer: Self-pay | Admitting: Internal Medicine

## 2022-03-16 ENCOUNTER — Other Ambulatory Visit: Payer: Self-pay | Admitting: Obstetrics and Gynecology

## 2022-03-16 DIAGNOSIS — Z1231 Encounter for screening mammogram for malignant neoplasm of breast: Secondary | ICD-10-CM

## 2022-03-17 ENCOUNTER — Other Ambulatory Visit: Payer: Self-pay | Admitting: Family Medicine

## 2022-03-29 ENCOUNTER — Other Ambulatory Visit: Payer: Self-pay | Admitting: Family Medicine

## 2022-03-29 DIAGNOSIS — Z13820 Encounter for screening for osteoporosis: Secondary | ICD-10-CM

## 2022-04-28 ENCOUNTER — Ambulatory Visit: Payer: Self-pay

## 2022-04-28 ENCOUNTER — Inpatient Hospital Stay: Admission: RE | Admit: 2022-04-28 | Payer: Self-pay | Source: Ambulatory Visit

## 2022-05-26 ENCOUNTER — Ambulatory Visit: Payer: Self-pay

## 2022-07-03 DIAGNOSIS — E559 Vitamin D deficiency, unspecified: Secondary | ICD-10-CM | POA: Diagnosis not present

## 2022-07-03 DIAGNOSIS — E785 Hyperlipidemia, unspecified: Secondary | ICD-10-CM | POA: Diagnosis not present

## 2022-07-03 DIAGNOSIS — I10 Essential (primary) hypertension: Secondary | ICD-10-CM | POA: Diagnosis not present

## 2022-07-05 DIAGNOSIS — H25811 Combined forms of age-related cataract, right eye: Secondary | ICD-10-CM | POA: Diagnosis not present

## 2022-07-05 DIAGNOSIS — H2522 Age-related cataract, morgagnian type, left eye: Secondary | ICD-10-CM | POA: Diagnosis not present

## 2022-07-08 ENCOUNTER — Encounter: Payer: Self-pay | Admitting: Cardiology

## 2022-07-08 ENCOUNTER — Ambulatory Visit: Payer: 59 | Admitting: Cardiology

## 2022-07-08 VITALS — BP 143/77 | HR 89 | Resp 17 | Ht 66.0 in | Wt 196.8 lb

## 2022-07-08 DIAGNOSIS — Z8673 Personal history of transient ischemic attack (TIA), and cerebral infarction without residual deficits: Secondary | ICD-10-CM | POA: Diagnosis not present

## 2022-07-08 DIAGNOSIS — I10 Essential (primary) hypertension: Secondary | ICD-10-CM

## 2022-07-08 DIAGNOSIS — R0609 Other forms of dyspnea: Secondary | ICD-10-CM | POA: Diagnosis not present

## 2022-07-08 DIAGNOSIS — E782 Mixed hyperlipidemia: Secondary | ICD-10-CM | POA: Diagnosis not present

## 2022-07-08 MED ORDER — ROSUVASTATIN CALCIUM 20 MG PO TABS: 20.0000 mg | ORAL_TABLET | Freq: Every day | ORAL | 0 refills | Status: AC

## 2022-07-08 NOTE — Progress Notes (Signed)
ID:  Christine Cole, DOB 01/28/1956, MRN [id number]  PCP:  Penelope Galas, MD  Cardiologist:  Tessa Lerner, DO, [facility] (established care January 05/2023) Former Cardiology Providers: NA  REASON FOR CONSULT: History of TIA/stroke without residual deficits  REQUESTING PHYSICIAN:  Chow, Zannie Cove, MD [address] [place],  [place] [address]  Chief Complaint  Patient presents with   Transient Ischemic Attack   New Patient (Initial Visit)    TIA & cerebral infarction    HPI  Christine Cole is a 67 y.o. Arabic speaking female who presents to the clinic for evaluation of prior TIA/stroke at the request of Chow, Zannie Cove, MD. Her past medical history and cardiovascular risk factors include: History of CVA x2, hypertension, hyperlipidemia, prediabetes, vitamin D deficiency, postmenopausal female.  Patient is accompanied by her daughter Christine Cole who also is helping translating between Arabic and English during today's encounter.  Her daughter is a Engineer, site at a primary care facility as well.  Patient was referred to the practice given her history of stroke x 2.  I have been told that she has had her first stroke in 2012 and the second stroke was in 2021.  Both of them have been documented with imaging findings back home and EEG at.  No residual deficits with the exception of generalized weakness.  In [place] she had an echocardiogram I do not have those records available for review but was told that she has cardiomegaly.  Patient denies undergoing transesophageal echocardiogram as part of the stroke workup.  She was also placed on Coumadin since 2012 and was on it until 2022.  Her PCP back in [place] transitioned her to aspirin 81 mg p.o. daily.  She denies anginal discomfort.  She does have shortness of breath with effort related activities which is chronic and stable since her first stroke.  FUNCTIONAL STATUS: No structured exercise program or daily routine.   ALLERGIES: No Known  Allergies  MEDICATION LIST PRIOR TO VISIT: Current Meds  Medication Sig   acetaminophen (TYLENOL) 500 MG tablet Take 500 mg by mouth every 6 (six) hours as needed for moderate pain.   albuterol (VENTOLIN HFA) 108 (90 Base) MCG/ACT inhaler Inhale 1 puff into the lungs every 4 (four) hours as needed.   amLODipine (NORVASC) 5 MG tablet Take 1 tablet (5 mg total) by mouth daily.   Aspirin 81 MG CAPS Take 81 mg by mouth daily.   bisoprolol (ZEBETA) 5 MG tablet Take 1 tablet by mouth daily.   losartan (COZAAR) 100 MG tablet Take 1 tablet by mouth daily.   Multiple Vitamin (MULTIVITAMIN WITH MINERALS) TABS tablet Take 1 tablet by mouth daily.   rosuvastatin (CRESTOR) 20 MG tablet Take 1 tablet (20 mg total) by mouth at bedtime.     PAST MEDICAL HISTORY: Past Medical History:  Diagnosis Date   Essential hypertension 12/23/2015   Hyperlipidemia 12/23/2015   Prediabetes    Stroke [facility])    family reports stroke 2 years ago   Varicose veins of both lower extremities without ulcer or inflammation 12/23/2015    PAST SURGICAL HISTORY: History reviewed. No pertinent surgical history.  FAMILY HISTORY: The patient family history includes Diabetes in her brother, brother, father, and sister; Stroke in her mother.  SOCIAL HISTORY:  The patient  reports that she has never smoked. She has never used smokeless tobacco. She reports that she does not drink alcohol and does not use drugs.  REVIEW OF SYSTEMS: Review of Systems  Cardiovascular:  Positive for dyspnea on exertion. Negative for chest pain, claudication, irregular heartbeat, leg swelling, near-syncope, orthopnea, palpitations, paroxysmal nocturnal dyspnea and syncope.  Respiratory:  Positive for shortness of breath.   Hematologic/Lymphatic: Negative for bleeding problem.  Musculoskeletal:  Negative for muscle cramps and myalgias.  Neurological:  Positive for weakness (chronic and stable). Negative for dizziness and light-headedness.     PHYSICAL EXAM:    07/08/2022    9:29 AM 12/23/2015    9:32 AM 10/17/2013    4:30 AM  Vitals with BMI  Height 5\' 6"  5\' 6"    Weight 196 lbs 13 oz 187 lbs 6 oz   BMI 16.10 96.0   Systolic 454 098 119  Diastolic 77 65 67  Pulse 89 77 94    Physical Exam  Constitutional: No distress.  Age appropriate, hemodynamically stable.   Neck: No JVD present.  Cardiovascular: Normal rate, regular rhythm, S1 normal, S2 normal, intact distal pulses and normal pulses. Exam reveals no gallop, no S3 and no S4.  No murmur heard. Pulmonary/Chest: Effort normal and breath sounds normal. No stridor. She has no wheezes. She has no rales.  Abdominal: Soft. Bowel sounds are normal. She exhibits no distension. There is no abdominal tenderness.  Musculoskeletal:        General: No edema.     Cervical back: Neck supple.  Neurological: She is alert and oriented to person, place, and time. She has intact cranial nerves (2-12).  Skin: Skin is warm and moist.   CARDIAC DATABASE: EKG: 07/08/2022: Sinus rhythm, 86 bpm, nonspecific T wave abnormality in inferolateral leads.  Echocardiogram: No results found for this or any previous visit from the past 1095 days.    Stress Testing: No results found for this or any previous visit from the past 1095 days.   Heart Catheterization: None  LABORATORY DATA: External Labs: Collected: 11/07/2021. Hemoglobin 13.6, hematocrit 43.2%. TSH 0.9 D-dimer 0.64  BUN 10, creatinine 0.49. Sodium 140, potassium 4, chloride 103, bicarb 28. AST 16, ALT 11, alkaline phosphatase 66 Total cholesterol 215, triglycerides 207, HDL 74, LDL 109, non-HDL 141  IMPRESSION:    ICD-10-CM   1. Hx of TIA (transient ischemic attack) and stroke  Z86.73 EKG 12-Lead    PCV ECHOCARDIOGRAM COMPLETE W BUBBLE    rosuvastatin (CRESTOR) 20 MG tablet    2. Dyspnea on exertion  R06.09     3. Essential hypertension  I10     4. Mixed hyperlipidemia  E78.2 rosuvastatin (CRESTOR) 20 MG tablet     Lipid Panel With LDL/HDL Ratio    Lipoprotein A (LPA)    LDL cholesterol, direct    CMP14+EGFR       RECOMMENDATIONS: Christine Cole is a 67 y.o. Arabic speaking female whose past medical history and cardiac risk factors include: History of CVA x2, hypertension, hyperlipidemia, prediabetes, vitamin D deficiency, postmenopausal female.  Patient referred to the practice given her history of stroke x 2.  According to her daughter she has had imaging studies back in [place] which have confirmed her prior strokes per MRI.  She has not undergone transthoracic echocardiogram with bubble study to evaluate for PFO or transesophageal echocardiogram.   Since her first stroke in 2012 she was placed on Coumadin by her provider back in [place].  There is no formal diagnosis of atrial fibrillation to the best of the patient and the daughter.  Her INRs were being managed by the patient and she was not checking her INRs regularly. While being on Coumadin she  had a stroke in 2021 and thereafter she was transitioned from Coumadin back down to an aspirin 81 mg p.o. daily.  Without a true diagnosis of atrial fibrillation or atrial flutter it is my best opinion not to anticoagulate her due to the risks of bleeding with anticoagulation.  However, I recommended proceeding with a loop recorder implantation for monitoring of atrial fibrillation/flutter that would deem anticoagulation appropriate in the right clinical setting.  After discussing the risks, benefits, alternatives patient and her daughter are agreeable to proceed forward.    Will proceed with a surface echocardiogram with bubble studies to evaluate for PFO.    The patient would like to hold off on transesophageal echocardiogram at this time until unless truly needed.  Given her comorbid conditions of prior strokes and prediabetes recommend statin therapy given her triglyceride levels and LDL as of May 2023.  Will start rosuvastatin 20 mg p.o. nightly with  follow-up labs in 6 weeks to evaluate for lipids and LFTs.  Patient denies anginal discomfort and has shortness of breath is chronic and stable.  We discussed undergoing ischemic workup at the follow up visit.   Data Reviewed: I have independently reviewed external notes provided by the referring provider as part of this office visit.   I have independently reviewed results of EKG, Labs,  as part of medical decision making. I have ordered the following tests:  Orders Placed This Encounter  Procedures   Lipid Panel With LDL/HDL Ratio    Standing Status:   Future    Standing Expiration Date:   07/09/2023   Lipoprotein A (LPA)    Standing Status:   Future    Standing Expiration Date:   07/09/2023   LDL cholesterol, direct    Standing Status:   Future    Standing Expiration Date:   07/09/2023   CMP14+EGFR    Standing Status:   Future    Standing Expiration Date:   07/09/2023   EKG 12-Lead   PCV ECHOCARDIOGRAM COMPLETE W BUBBLE    Standing Status:   Future    Standing Expiration Date:   07/09/2023   I have made medications changes at today's encounter as noted above. History of present illness was obtained by both patient and daughter (who also serves as a Optometrist during today's encounter) at today's office visit.     FINAL MEDICATION LIST END OF ENCOUNTER: Meds ordered this encounter  Medications   rosuvastatin (CRESTOR) 20 MG tablet    Sig: Take 1 tablet (20 mg total) by mouth at bedtime.    Dispense:  90 tablet    Refill:  0    There are no discontinued medications.   Current Outpatient Medications:    acetaminophen (TYLENOL) 500 MG tablet, Take 500 mg by mouth every 6 (six) hours as needed for moderate pain., Disp: , Rfl:    albuterol (VENTOLIN HFA) 108 (90 Base) MCG/ACT inhaler, Inhale 1 puff into the lungs every 4 (four) hours as needed., Disp: , Rfl:    amLODipine (NORVASC) 5 MG tablet, Take 1 tablet (5 mg total) by mouth daily., Disp: 90 tablet, Rfl: 0   Aspirin 81 MG  CAPS, Take 81 mg by mouth daily., Disp: , Rfl:    bisoprolol (ZEBETA) 5 MG tablet, Take 1 tablet by mouth daily., Disp: , Rfl:    losartan (COZAAR) 100 MG tablet, Take 1 tablet by mouth daily., Disp: , Rfl:    Multiple Vitamin (MULTIVITAMIN WITH MINERALS) TABS tablet, Take 1 tablet by mouth  daily., Disp: , Rfl:    rosuvastatin (CRESTOR) 20 MG tablet, Take 1 tablet (20 mg total) by mouth at bedtime., Disp: 90 tablet, Rfl: 0  Orders Placed This Encounter  Procedures   Lipid Panel With LDL/HDL Ratio   Lipoprotein A (LPA)   LDL cholesterol, direct   CMP14+EGFR   EKG 12-Lead   PCV ECHOCARDIOGRAM COMPLETE W BUBBLE    There are no Patient Instructions on file for this visit.   --Continue cardiac medications as reconciled in final medication list. --Return in about 7 weeks (around 08/26/2022) for Follow up hx of cva and , Review test results. or sooner if needed. --Continue follow-up with your primary care physician regarding the management of your other chronic comorbid conditions.  Patient's questions and concerns were addressed to her satisfaction. She voices understanding of the instructions provided during this encounter.   This note was created using a voice recognition software as a result there may be grammatical errors inadvertently enclosed that do not reflect the nature of this encounter. Every attempt is made to correct such errors.  Rex Kras, [place], [facility]  Pager: [phone] Office: [phone]

## 2022-07-09 DIAGNOSIS — R7301 Impaired fasting glucose: Secondary | ICD-10-CM | POA: Diagnosis not present

## 2022-07-11 LAB — LAB REPORT - SCANNED
A1c: 7.9
EGFR: 101

## 2022-07-12 ENCOUNTER — Ambulatory Visit: Payer: 59

## 2022-07-12 DIAGNOSIS — I10 Essential (primary) hypertension: Secondary | ICD-10-CM | POA: Diagnosis not present

## 2022-07-12 DIAGNOSIS — R0609 Other forms of dyspnea: Secondary | ICD-10-CM | POA: Diagnosis not present

## 2022-07-12 DIAGNOSIS — Z8673 Personal history of transient ischemic attack (TIA), and cerebral infarction without residual deficits: Secondary | ICD-10-CM

## 2022-07-15 ENCOUNTER — Other Ambulatory Visit: Payer: 59

## 2022-07-18 ENCOUNTER — Other Ambulatory Visit: Payer: 59

## 2022-07-22 ENCOUNTER — Other Ambulatory Visit: Payer: 59

## 2022-08-12 NOTE — Progress Notes (Signed)
Left message with reminder of 3/1 appt and that we would review results at this appointment.

## 2022-08-26 ENCOUNTER — Ambulatory Visit: Payer: 59 | Admitting: Cardiology

## 2022-09-01 NOTE — Progress Notes (Signed)
LMTCB

## 2022-09-05 NOTE — Progress Notes (Signed)
Patient is rescheduled

## 2023-06-07 ENCOUNTER — Ambulatory Visit: Payer: No Typology Code available for payment source | Admitting: Dermatology
